# Patient Record
Sex: Female | Born: 1980 | Race: White | Hispanic: No | Marital: Single | Smoking: Former smoker
Health system: Southern US, Community
[De-identification: ages and names within clinical notes are randomized; demographics above are authoritative.]

---

## 2005-06-17 ENCOUNTER — Emergency Department: Payer: Self-pay | Admitting: Emergency Medicine

## 2005-06-20 ENCOUNTER — Ambulatory Visit: Payer: Self-pay | Admitting: Family Medicine

## 2006-03-27 IMAGING — CT CT STONE STUDY
1 of 2 series · 16 of 32 positions shown, 20 images · non-contrast
Comparison: none

REASON FOR EXAM: Stone
COMMENTS:

PROCEDURE:     CT  - CT ABDOMEN /PELVIS WO (STONE)  - June 17, 2005  [DATE]
RESULT:     Noncontrast emergent CT scan of abdomen and pelvis is performed
to evaluate for urinary tract stones.

[Series 2: stone · axial · 0.63mm/px · z∈[-436,-50]mm · 16 of 141 slices shown, 20 images]
[im 6/141  soft-tissue]
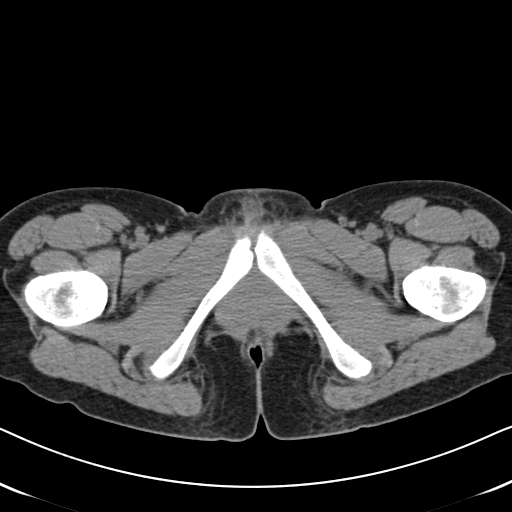
[im 6/141  bone]
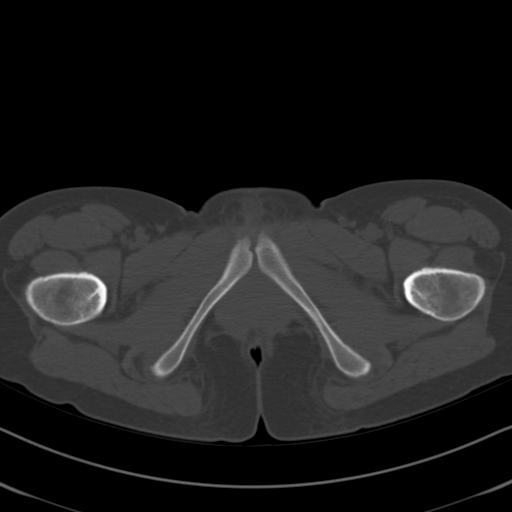
[im 17/141  soft-tissue]
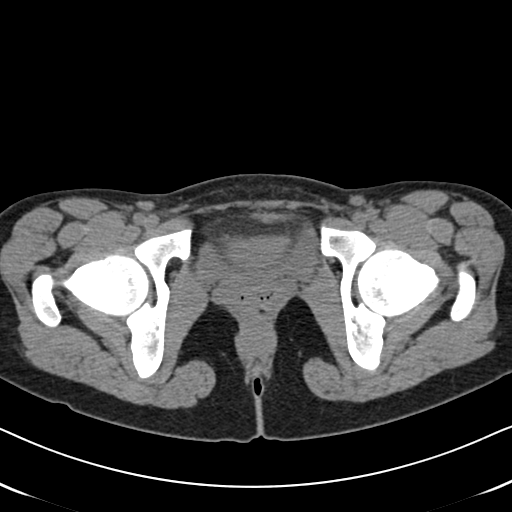
[im 29/141  soft-tissue]
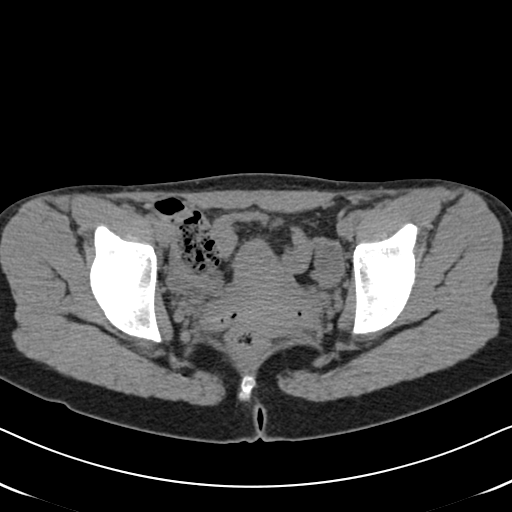
[im 40/141  soft-tissue]
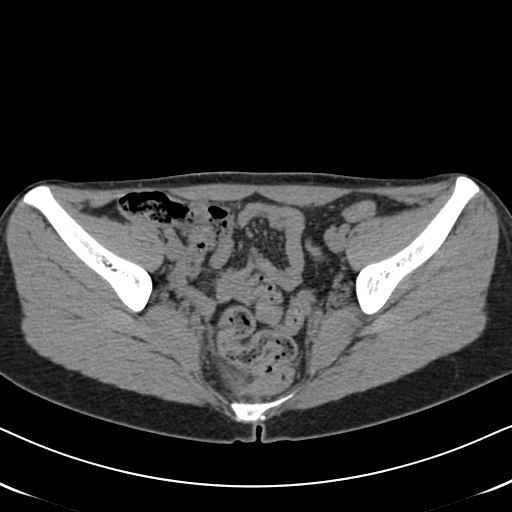
[im 45/141  soft-tissue]
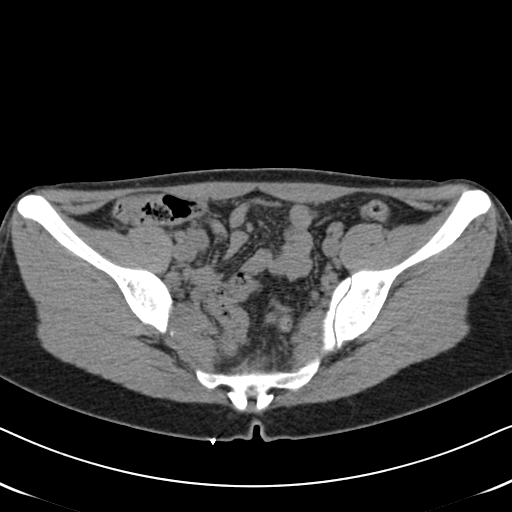
[im 57/141  soft-tissue]
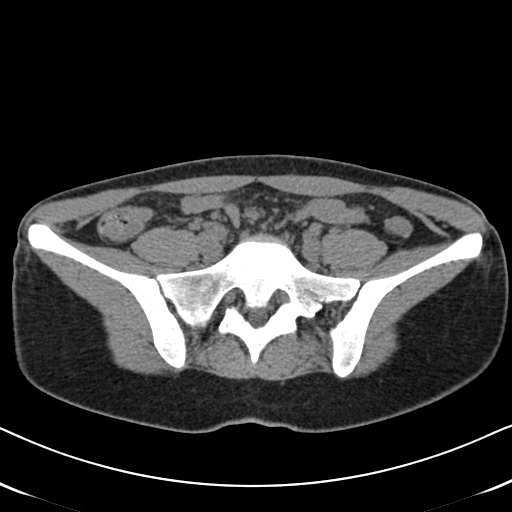
[im 68/141  soft-tissue]
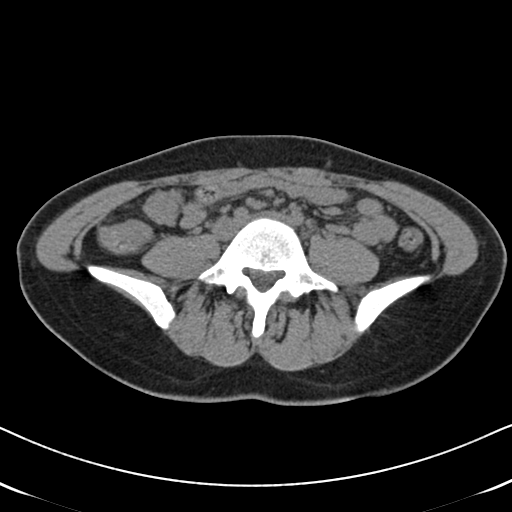
[im 73/141  soft-tissue]
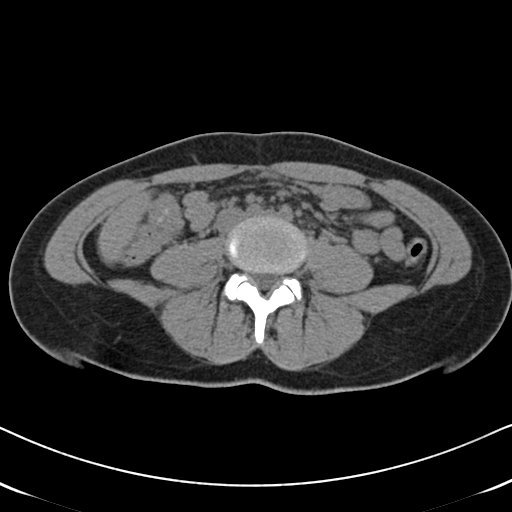
[im 85/141  soft-tissue]
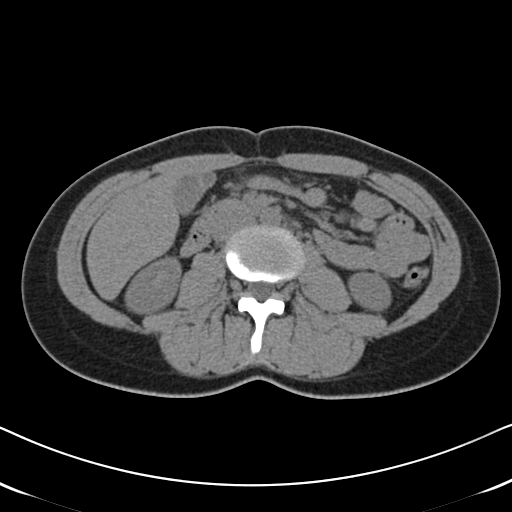
[im 85/141  bone]
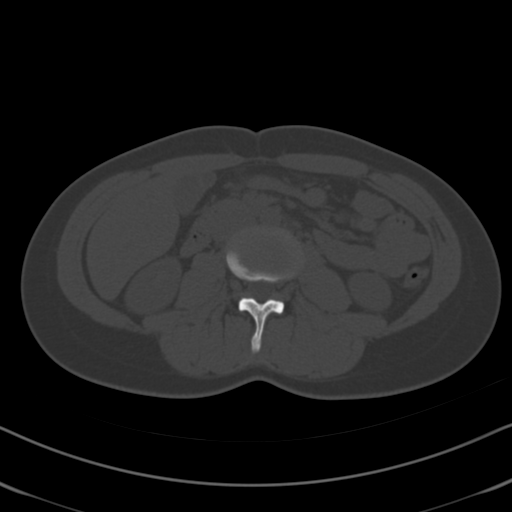
[im 96/141  soft-tissue]
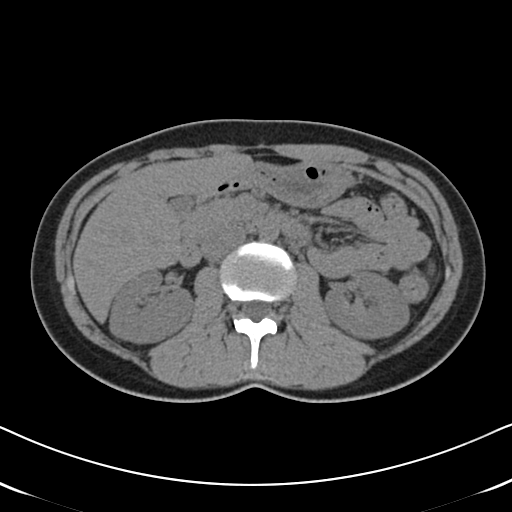
[im 107/141  soft-tissue]
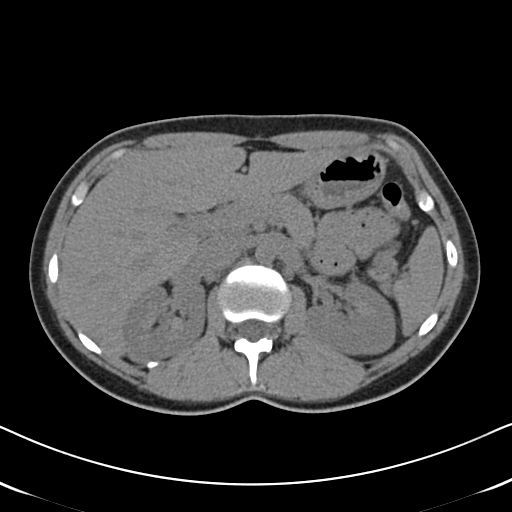
[im 113/141  soft-tissue]
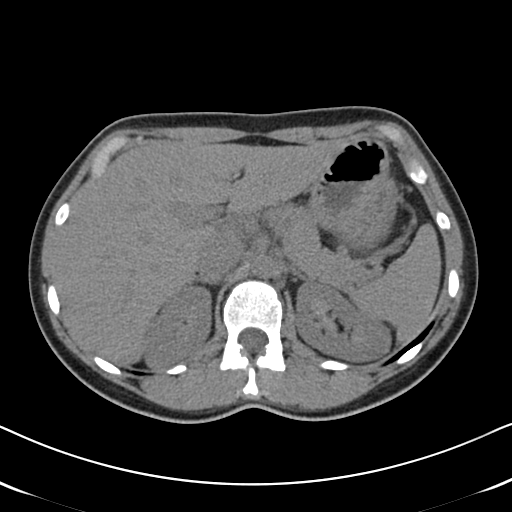
[im 118/141  lung]
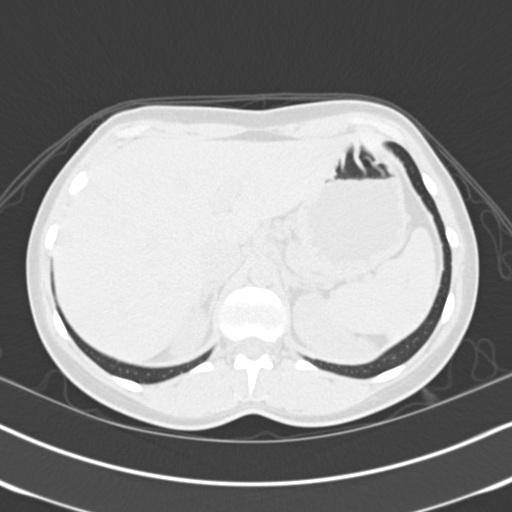
[im 124/141  soft-tissue]
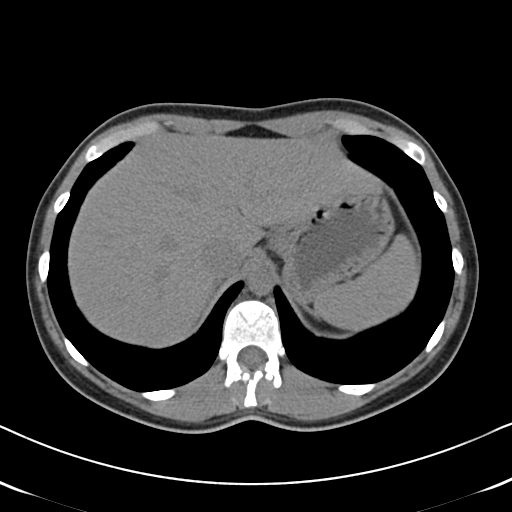
[im 124/141  lung]
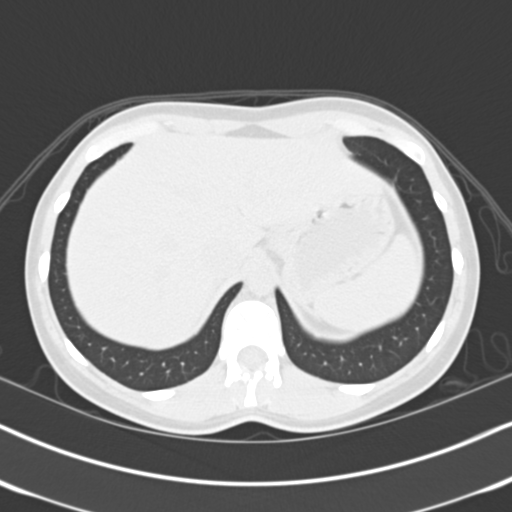
[im 129/141  lung]
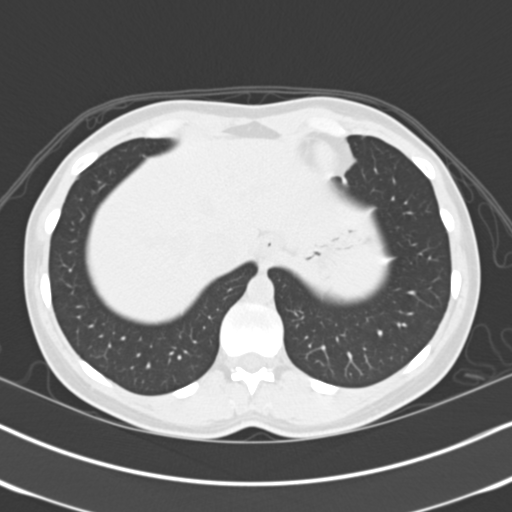
[im 135/141  soft-tissue]
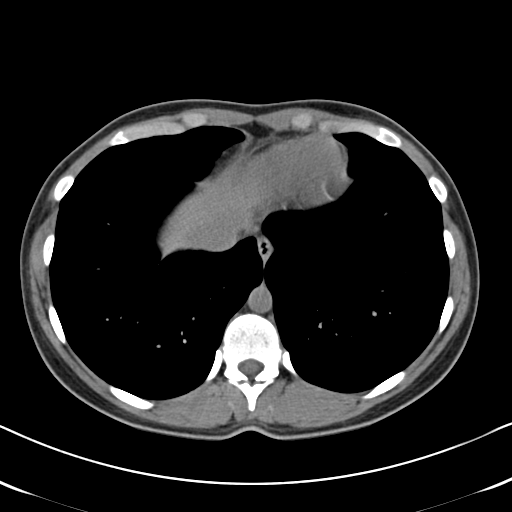
[im 135/141  lung]
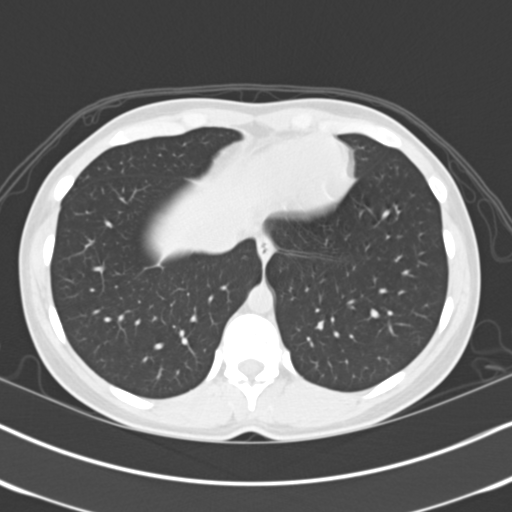

[16 of 32 positions shown; findings below may reference images not displayed]

FINDINGS: The lung bases are fully inflated and clear. The images
demonstrate phleboliths present in the pelvic region. The urinary bladder is
nondistended and contains no stones. The distal ureters and proximal ureters
are non-obstructed. There are some very faint punctate areas of density in
the RIGHT kidney in the caliceal regions which could represent
nephrocalcinosis or early stone formation. These are extremely subtle but
there is no hydronephrosis. There are no radiopaque gallstones present.
There is no free fluid or free air. No drainable loculated fluid collections
are evident.
IMPRESSION: No obstructive changes. I cannot exclude some minimal
nephrocalcinosis on the RIGHT.

## 2012-05-15 ENCOUNTER — Inpatient Hospital Stay: Payer: Self-pay | Admitting: Obstetrics and Gynecology

## 2012-05-15 LAB — CBC WITH DIFFERENTIAL/PLATELET
Basophil #: 0 10*3/uL (ref 0.0–0.1)
Eosinophil %: 0.8 %
HCT: 31.9 % — ABNORMAL LOW (ref 35.0–47.0)
HGB: 11.3 g/dL — ABNORMAL LOW (ref 12.0–16.0)
Lymphocyte #: 1.5 10*3/uL (ref 1.0–3.6)
Lymphocyte %: 16.1 %
MCH: 32.2 pg (ref 26.0–34.0)
MCV: 91 fL (ref 80–100)
Monocyte #: 0.8 x10 3/mm (ref 0.2–0.9)
Monocyte %: 8.9 %
Neutrophil #: 6.9 10*3/uL — ABNORMAL HIGH (ref 1.4–6.5)
Neutrophil %: 74 %
Platelet: 186 10*3/uL (ref 150–440)
RDW: 13.5 % (ref 11.5–14.5)
WBC: 9.3 10*3/uL (ref 3.6–11.0)

## 2012-05-17 LAB — HEMOGLOBIN: HGB: 8.4 g/dL — ABNORMAL LOW (ref 12.0–16.0)

## 2013-05-21 ENCOUNTER — Ambulatory Visit: Payer: Self-pay | Admitting: Family Medicine

## 2014-02-28 IMAGING — CT CT HEAD WITHOUT CONTRAST
1 series · 16 of 29 positions shown, 20 images · non-contrast
Comparison: none

REASON FOR EXAM: headache
COMMENTS:

[Series 2: soft tissue · axial · 0.39mm/px · z∈[-65,+65]mm · 16 of 29 slices shown, 20 images]
[im 2/29  brain]
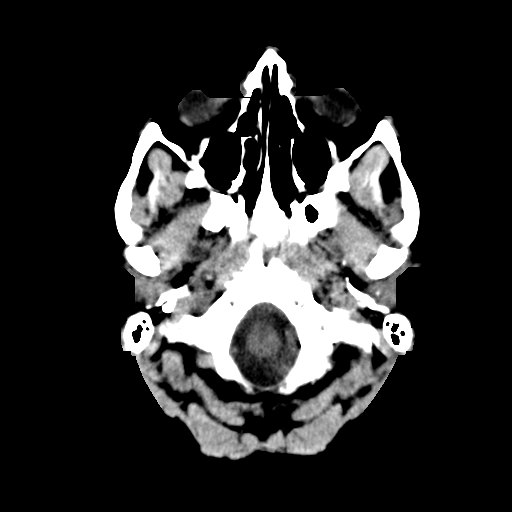
[im 2/29  bone]
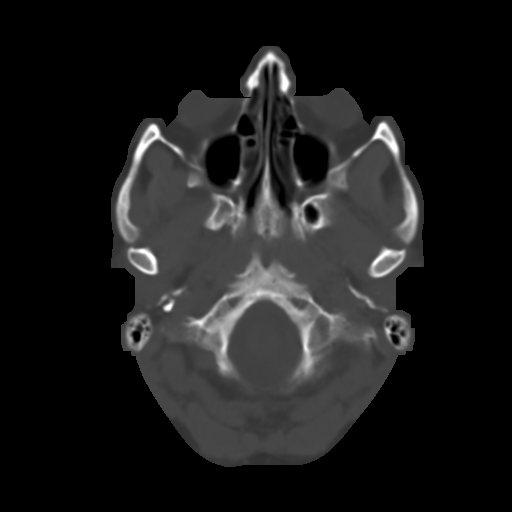
[im 4/29  brain]
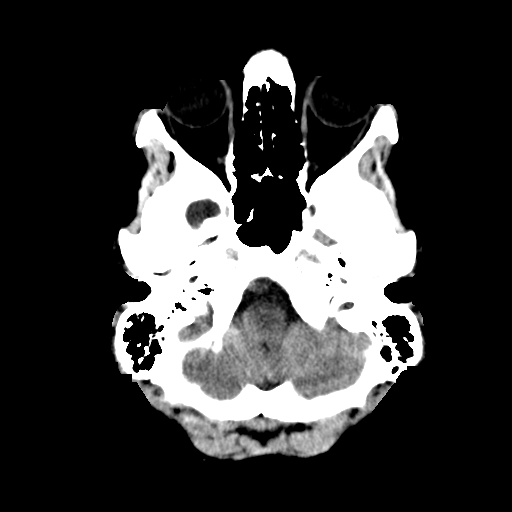
[im 6/29  brain]
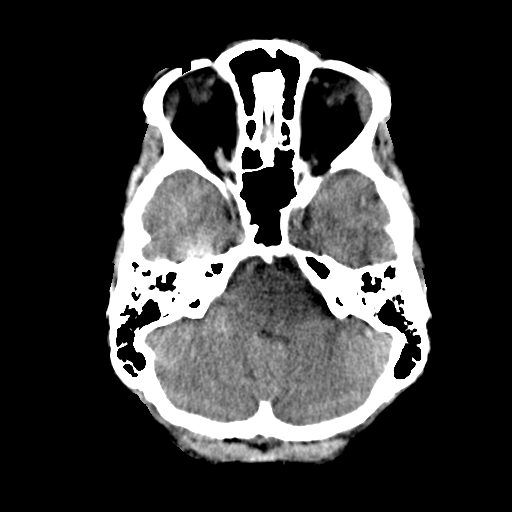
[im 7/29  brain]
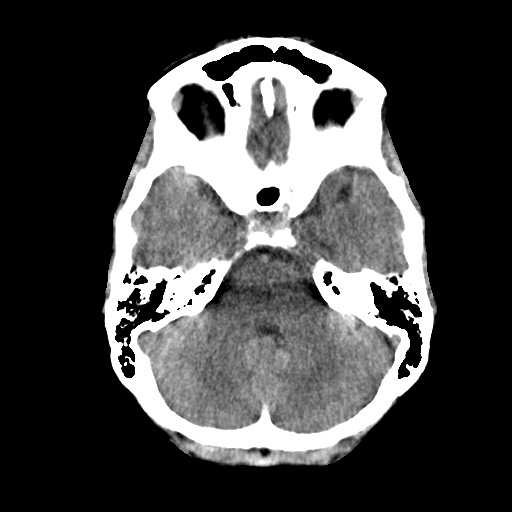
[im 9/29  brain]
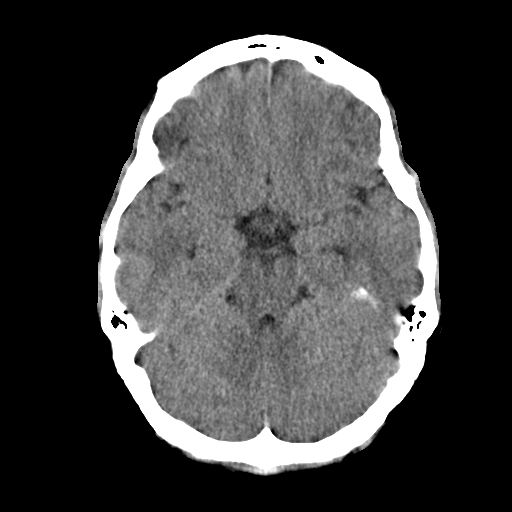
[im 9/29  bone]
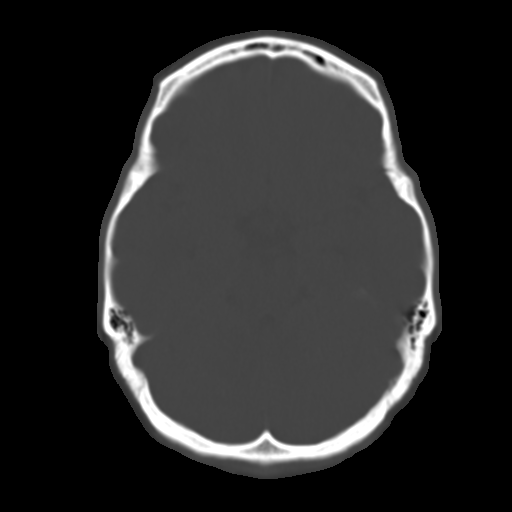
[im 11/29  brain]
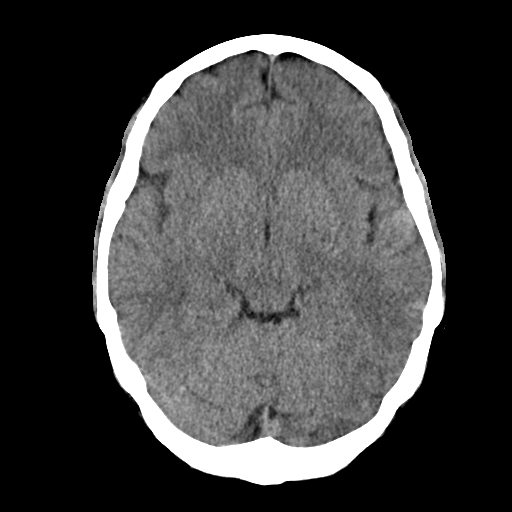
[im 12/29  brain]
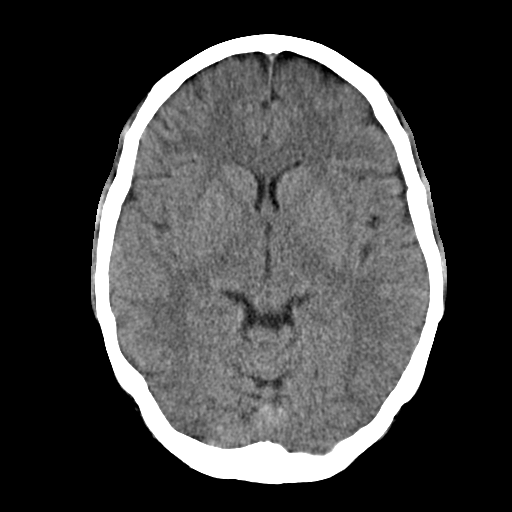
[im 14/29  brain]
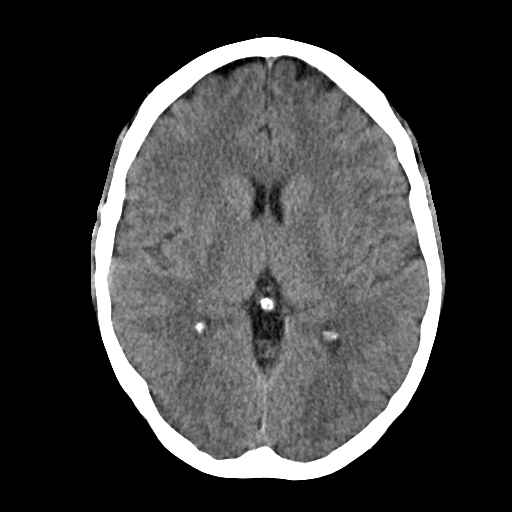
[im 16/29  brain]
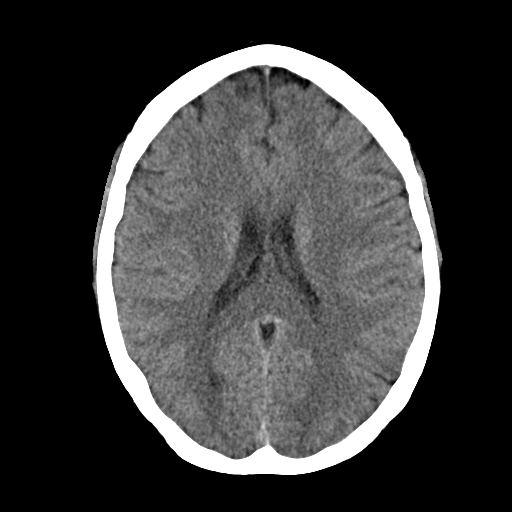
[im 16/29  bone]
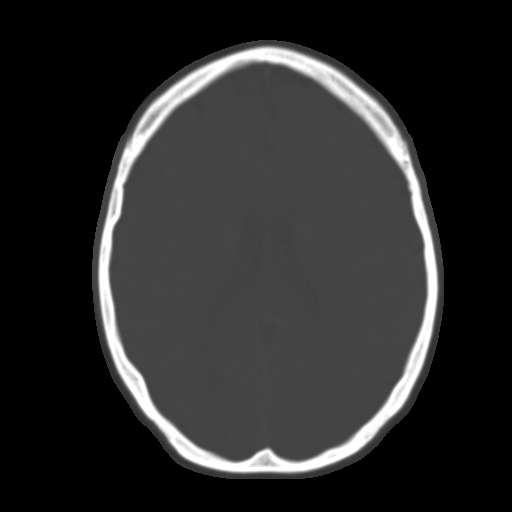
[im 18/29  brain]
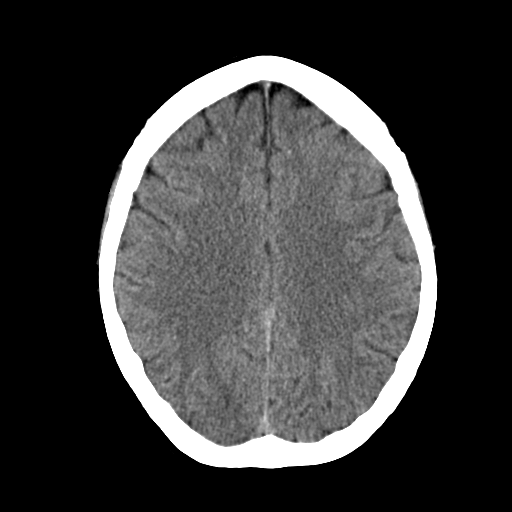
[im 19/29  brain]
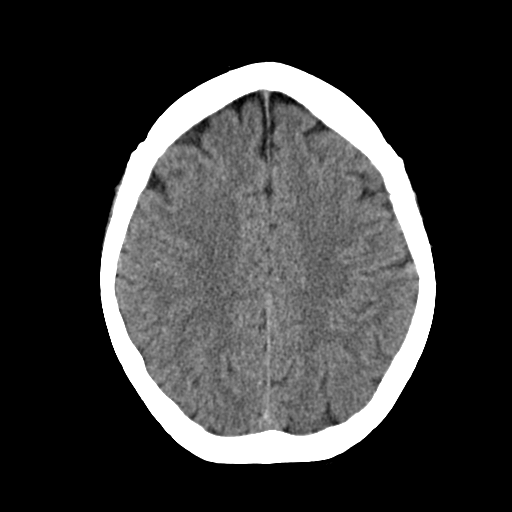
[im 21/29  brain]
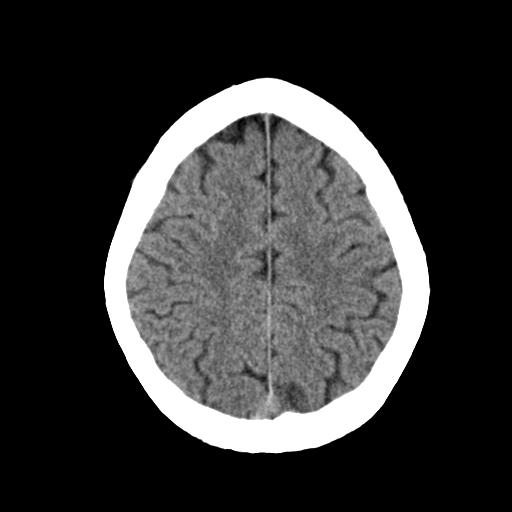
[im 23/29  brain]
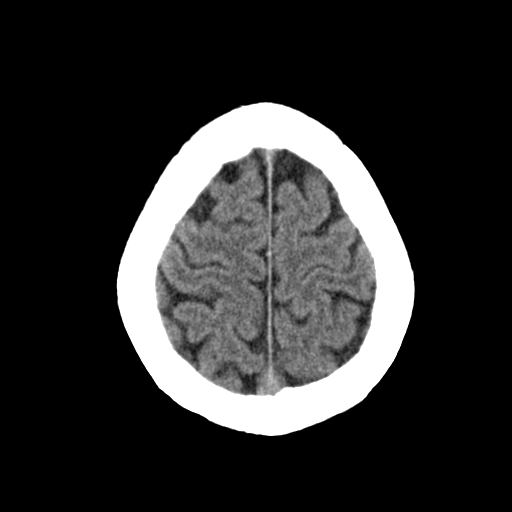
[im 23/29  bone]
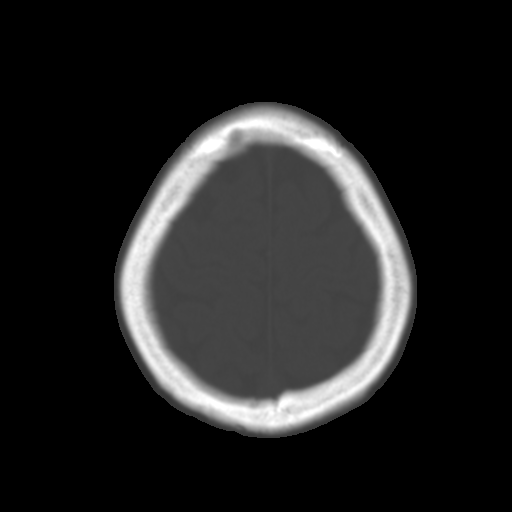
[im 24/29  brain]
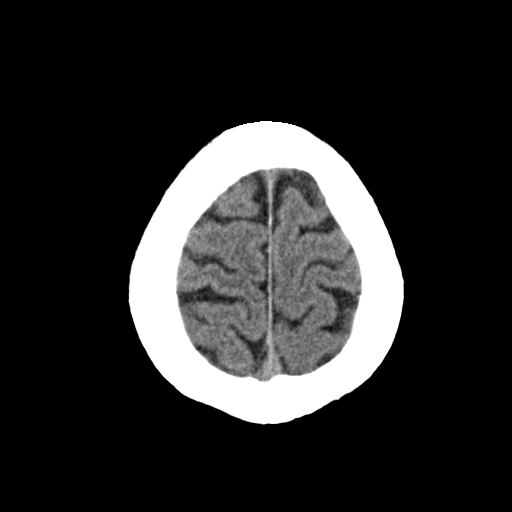
[im 26/29  brain]
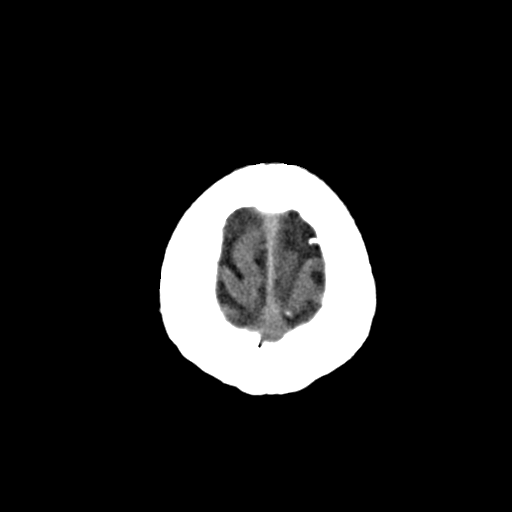
[im 28/29  brain]
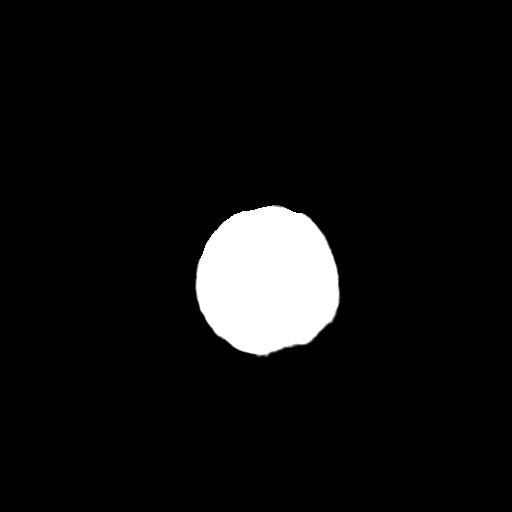

[16 of 29 positions shown; findings below may reference images not displayed]

PROCEDURE:     ALIN - SAM KEEL WITHOUT CONTRAST  - May 21, 2013 [DATE]

RESULT:     Noncontrast CT of the brain is performed in the standard fashion.

The ventricles and sulci are normal. There is no hemorrhage. There is no
focal mass, mass-effect or midline shift. There is no evidence of edema or
territorial infarct. The bone windows demonstrate normal aeration of the
paranasal sinuses and mastoid air cells. There is no skull fracture
demonstrated.
IMPRESSION: 1. No acute intracranial abnormality.

[REDACTED]

## 2015-02-10 NOTE — H&P (Signed)
L&D Evaluation:  History:   HPI 34 y/o G2P1 [id number]    Presents with abdominal pain, contractions    Patient's Medical History No Chronic Illness    Patient's Surgical History none    Medications Pre Natal Vitamins    Allergies NKDA    Social History none    Family History Non-Contributory   ROS:   ROS All systems were reviewed.  HEENT, CNS, GI, GU, Respiratory, CV, Renal and Musculoskeletal systems were found to be normal.   Exam:   Vital Signs stable    Abdomen gravid, non-tender    Estimated Fetal Weight Average for gestational age    Back no CVAT    Pelvic 4.5/75/-2    Mebranes AROM clear    FHT normal rate with no decels    Ucx irregular   Impression:   Impression early labor   Plan:   Plan monitor contractions and for cervical change, fluids    Comments Walk OK for epidural prn   Electronic Signatures: Margaretha GlassingEvans, Ricky L (MD)  (Signed 13-Aug-13 18:02)  Authored: L&D Evaluation   Last Updated: 13-Aug-13 18:02 by Margaretha GlassingEvans, Ricky L (MD)

## 2015-03-05 ENCOUNTER — Ambulatory Visit (INDEPENDENT_AMBULATORY_CARE_PROVIDER_SITE_OTHER): Payer: PRIVATE HEALTH INSURANCE | Admitting: Family Medicine

## 2015-03-05 ENCOUNTER — Encounter: Payer: Self-pay | Admitting: Family Medicine

## 2015-03-05 VITALS — BP 130/70 | HR 64 | Temp 98.2°F | Ht 68.0 in | Wt 138.0 lb

## 2015-03-05 DIAGNOSIS — H6501 Acute serous otitis media, right ear: Secondary | ICD-10-CM | POA: Diagnosis not present

## 2015-03-05 DIAGNOSIS — H6983 Other specified disorders of Eustachian tube, bilateral: Secondary | ICD-10-CM | POA: Diagnosis not present

## 2015-03-05 MED ORDER — FLUTICASONE PROPIONATE 50 MCG/ACT NA SUSP: 2.0000 | Freq: Every day | NASAL | Status: DC

## 2015-03-05 MED ORDER — AMOXICILLIN 500 MG PO CAPS: 500.0000 mg | ORAL_CAPSULE | Freq: Three times a day (TID) | ORAL | Status: DC

## 2015-03-05 NOTE — Progress Notes (Signed)
Name: Faith Mckinney   MRN: [id number]    DOB: 18-Jun-1981   Date:03/05/2015       Progress Note  Subjective  Chief Complaint  Chief Complaint  Patient presents with  . ear pressure    went swimming over the weekend- R) ear hurts  . Sinusitis    with ear pain    Ear Fullness  There is pain in the right ear. This is a new problem. The current episode started yesterday. The problem occurs constantly. The problem has been unchanged. There has been no fever. The patient is experiencing no pain. Pertinent negatives include no abdominal pain, coughing, diarrhea, ear discharge, headaches, neck pain, rash or sore throat. She has tried nothing for the symptoms. The treatment provided no relief. There is no history of a chronic ear infection or hearing loss.     No problem-specific assessment & plan notes found for this encounter.   History reviewed. No pertinent past medical history.  History reviewed. No pertinent past surgical history.  Family History  Problem Relation Age of Onset  . Hypertension Father     History   Social History  . Marital Status: Single    Spouse Name: N/A  . Number of Children: N/A  . Years of Education: N/A   Occupational History  . Not on file.   Social History Main Topics  . Smoking status: Former Games developermoker  . Smokeless tobacco: Not on file  . Alcohol Use: 0.0 oz/week    0 Standard drinks or equivalent per week  . Drug Use: No  . Sexual Activity: Not on file   Other Topics Concern  . Not on file   Social History Narrative  . No narrative on file     Current outpatient prescriptions:  .  amoxicillin (AMOXIL) 500 MG capsule, Take 1 capsule (500 mg total) by mouth 3 (three) times daily., Disp: 30 capsule, Rfl: 0 .  fluticasone (FLONASE) 50 MCG/ACT nasal spray, Place 2 sprays into both nostrils daily., Disp: 16 g, Rfl: 6  No Known Allergies   Review of Systems  Constitutional: Negative for fever, chills, weight loss and malaise/fatigue.   HENT: Negative for ear discharge, ear pain and sore throat.   Eyes: Negative for blurred vision.  Respiratory: Negative for cough, sputum production, shortness of breath and wheezing.   Cardiovascular: Negative for chest pain, palpitations and leg swelling.  Gastrointestinal: Negative for heartburn, nausea, abdominal pain, diarrhea, constipation, blood in stool and melena.  Genitourinary: Negative for dysuria, urgency, frequency and hematuria.  Musculoskeletal: Negative for myalgias, back pain, joint pain and neck pain.  Skin: Negative for rash.  Neurological: Negative for dizziness, tingling, sensory change, focal weakness and headaches.  Endo/Heme/Allergies: Negative for environmental allergies and polydipsia. Does not bruise/bleed easily.  Psychiatric/Behavioral: Negative for depression and suicidal ideas. The patient is not nervous/anxious and does not have insomnia.      Objective  Filed Vitals:   03/05/15 0823  BP: 130/70  Pulse: 64  Temp: 98.2 F (36.8 C)  Height: 5\' 8"  (1.727 m)  Weight: 138 lb (62.596 kg)    Physical Exam  Constitutional: She is well-developed, well-nourished, and in no distress.  HENT:  Head: Normocephalic.  Right Ear: External ear normal. Tympanic membrane is erythematous. Tympanic membrane is not retracted. A middle ear effusion is present.  Left Ear: External ear normal. Tympanic membrane is not retracted.  Mouth/Throat: No oropharyngeal exudate.  Eyes: Conjunctivae and EOM are normal. Pupils are equal, round, and reactive  to light. Right eye exhibits no discharge. Left eye exhibits no discharge. No scleral icterus.  Neck: Normal range of motion. Neck supple. No JVD present. No thyromegaly present.  Cardiovascular: Normal rate and normal heart sounds.   No murmur heard. Pulmonary/Chest: No respiratory distress. She has no wheezes. She has no rales.  Abdominal: Soft. There is no hepatomegaly. There is no tenderness.  Lymphadenopathy:    She has  no cervical adenopathy.  Skin: She is not diaphoretic. No erythema.      No results found for this or any previous visit (from the past 2160 hour(s)).   Assessment & Plan  Problem List Items Addressed This Visit    None    Visit Diagnoses    Acute dysfunction of both Eustachian tubes    -  Primary    Relevant Medications    fluticasone (FLONASE) 50 MCG/ACT nasal spray    Right acute serous otitis media, recurrence not specified        Relevant Medications    amoxicillin (AMOXIL) 500 MG capsule       Meds ordered this encounter  Medications  . DISCONTD: desogestrel-ethinyl estradiol (KARIVA,AZURETTE,MIRCETTE) 0.15-0.02/0.01 MG (21/5) tablet    Sig: Take 1 tablet by mouth daily. Dr Feliberto Gottron  . amoxicillin (AMOXIL) 500 MG capsule    Sig: Take 1 capsule (500 mg total) by mouth 3 (three) times daily.    Dispense:  30 capsule    Refill:  0  . fluticasone (FLONASE) 50 MCG/ACT nasal spray    Sig: Place 2 sprays into both nostrils daily.    Dispense:  16 g    Refill:  6     Dr. Elizabeth Sauer [facility]  03/05/2015

## 2018-04-09 ENCOUNTER — Ambulatory Visit: Payer: 59 | Admitting: Family Medicine

## 2018-04-09 ENCOUNTER — Encounter: Payer: Self-pay | Admitting: Family Medicine

## 2018-04-09 ENCOUNTER — Other Ambulatory Visit: Payer: Self-pay

## 2018-04-09 VITALS — BP 120/70 | HR 80 | Ht 68.0 in | Wt 134.0 lb

## 2018-04-09 DIAGNOSIS — B029 Zoster without complications: Secondary | ICD-10-CM | POA: Diagnosis not present

## 2018-04-09 MED ORDER — TRAMADOL HCL 50 MG PO TABS: 50.0000 mg | ORAL_TABLET | Freq: Four times a day (QID) | ORAL | 0 refills | Status: AC | PRN

## 2018-04-09 MED ORDER — VALACYCLOVIR HCL 1 G PO TABS: 1000.0000 mg | ORAL_TABLET | Freq: Three times a day (TID) | ORAL | 0 refills | Status: AC

## 2018-04-09 NOTE — Progress Notes (Signed)
Name: Faith Mckinney   MRN: [id number]    DOB: 1981/09/21   Date:04/09/2018       Progress Note  Subjective  Chief Complaint  Chief Complaint  Patient presents with  . Herpes Zoster    started with rash on Saturday. burns and itches.     Rash  This is a new problem. The current episode started in the past 7 days (saturday). The problem has been gradually worsening since onset. Location: left torso. The rash is characterized by blistering. She was exposed to nothing (zoster). Pertinent negatives include no anorexia, congestion, cough, diarrhea, eye pain, facial edema, fatigue, fever, joint pain, nail changes, rhinorrhea, shortness of breath, sore throat or vomiting. Past treatments include nothing. The treatment provided moderate relief.    No problem-specific Assessment & Plan notes found for this encounter.   History reviewed. No pertinent past medical history.  History reviewed. No pertinent surgical history.  Family History  Problem Relation Age of Onset  . Hypertension Father     Social History   Socioeconomic History  . Marital status: Single    Spouse name: Not on file  . Number of children: Not on file  . Years of education: Not on file  . Highest education level: Not on file  Occupational History  . Not on file  Social Needs  . Financial resource strain: Not on file  . Food insecurity:    Worry: Not on file    Inability: Not on file  . Transportation needs:    Medical: Not on file    Non-medical: Not on file  Tobacco Use  . Smoking status: Former Games developermoker  . Smokeless tobacco: Never Used  Substance and Sexual Activity  . Alcohol use: Yes    Alcohol/week: 0.0 oz  . Drug use: No  . Sexual activity: Not on file  Lifestyle  . Physical activity:    Days per week: Not on file    Minutes per session: Not on file  . Stress: Not on file  Relationships  . Social connections:    Talks on phone: Not on file    Gets together: Not on file    Attends religious  service: Not on file    Active member of club or organization: Not on file    Attends meetings of clubs or organizations: Not on file    Relationship status: Not on file  . Intimate partner violence:    Fear of current or ex partner: Not on file    Emotionally abused: Not on file    Physically abused: Not on file    Forced sexual activity: Not on file  Other Topics Concern  . Not on file  Social History Narrative  . Not on file    No Known Allergies  Outpatient Medications Prior to Visit  Medication Sig Dispense Refill  . desogestrel-ethinyl estradiol (KARIVA,AZURETTE,MIRCETTE) 0.15-0.02/0.01 MG (21/5) tablet Take 1 tablet by mouth daily.    Marland Kitchen. amoxicillin (AMOXIL) 500 MG capsule Take 1 capsule (500 mg total) by mouth 3 (three) times daily. 30 capsule 0  . fluticasone (FLONASE) 50 MCG/ACT nasal spray Place 2 sprays into both nostrils daily. 16 g 6   No facility-administered medications prior to visit.     Review of Systems  Constitutional: Negative for chills, fatigue, fever, malaise/fatigue and weight loss.  HENT: Negative for congestion, ear discharge, ear pain, rhinorrhea and sore throat.   Eyes: Negative for blurred vision and pain.  Respiratory: Negative for cough, sputum  production, shortness of breath and wheezing.   Cardiovascular: Negative for chest pain, palpitations and leg swelling.  Gastrointestinal: Negative for abdominal pain, anorexia, blood in stool, constipation, diarrhea, heartburn, melena, nausea and vomiting.  Genitourinary: Negative for dysuria, frequency, hematuria and urgency.  Musculoskeletal: Negative for back pain, joint pain, myalgias and neck pain.  Skin: Positive for rash. Negative for nail changes.  Neurological: Negative for dizziness, tingling, sensory change, focal weakness and headaches.  Endo/Heme/Allergies: Negative for environmental allergies and polydipsia. Does not bruise/bleed easily.  Psychiatric/Behavioral: Negative for depression and  suicidal ideas. The patient is not nervous/anxious and does not have insomnia.      Objective  Vitals:   04/09/18 1633  BP: 120/70  Pulse: 80  Weight: 134 lb (60.8 kg)  Height: 5\' 8"  (1.727 m)    Physical Exam  Constitutional: She is oriented to person, place, and time. She appears well-developed and well-nourished.  HENT:  Head: Normocephalic.  Right Ear: External ear normal.  Left Ear: External ear normal.  Mouth/Throat: Oropharynx is clear and moist.  Eyes: Pupils are equal, round, and reactive to light. Conjunctivae and EOM are normal. Lids are everted and swept, no foreign bodies found. Left eye exhibits no hordeolum. No foreign body present in the left eye. Right conjunctiva is not injected. Left conjunctiva is not injected. No scleral icterus.  Neck: Normal range of motion. Neck supple. No JVD present. No tracheal deviation present. No thyromegaly present.  Cardiovascular: Normal rate, regular rhythm, normal heart sounds and intact distal pulses. Exam reveals no gallop and no friction rub.  No murmur heard. Pulmonary/Chest: Effort normal and breath sounds normal. No respiratory distress. She has no wheezes. She has no rales.  Abdominal: Soft. Bowel sounds are normal. She exhibits no mass. There is no hepatosplenomegaly. There is no tenderness. There is no rebound and no guarding.  Musculoskeletal: Normal range of motion. She exhibits no edema or tenderness.  Lymphadenopathy:    She has no cervical adenopathy.  Neurological: She is alert and oriented to person, place, and time. She has normal strength. She displays normal reflexes. No cranial nerve deficit.  Skin: Skin is warm. No rash noted.  Psychiatric: She has a normal mood and affect. Her mood appears not anxious. She does not exhibit a depressed mood.  Nursing note and vitals reviewed.     Assessment & Plan  Problem List Items Addressed This Visit    None    Visit Diagnoses    Herpes zoster without complication     -  Primary   prescribed Valacyclovir and  gave Tramadol for pain   Relevant Medications   valACYclovir (VALTREX) 1000 MG tablet      Meds ordered this encounter  Medications  . valACYclovir (VALTREX) 1000 MG tablet    Sig: Take 1 tablet (1,000 mg total) by mouth 3 (three) times daily.    Dispense:  21 tablet    Refill:  0  . traMADol (ULTRAM) 50 MG tablet    Sig: Take 1 tablet (50 mg total) by mouth every 6 (six) hours as needed for up to 5 days.    Dispense:  20 tablet    Refill:  0      Dr. [facility]  04/09/18

## 2018-04-09 NOTE — Patient Instructions (Signed)
Shingles Shingles, which is also known as herpes zoster, is an infection that causes a painful skin rash and fluid-filled blisters. Shingles is not related to genital herpes, which is a sexually transmitted infection. Shingles only develops in people who:  Have had chickenpox.  Have received the chickenpox vaccine. (This is rare.)  What are the causes? Shingles is caused by varicella-zoster virus (VZV). This is the same virus that causes chickenpox. After exposure to VZV, the virus stays in the body in an inactive (dormant) state. Shingles develops if the virus reactivates. This can happen many years after the initial exposure to VZV. It is not known what causes this virus to reactivate. What increases the risk? People who have had chickenpox or received the chickenpox vaccine are at risk for shingles. Infection is more common in people who:  Are older than age 50.  Have a weakened defense (immune) system, such as those with HIV, AIDS, or cancer.  Are taking medicines that weaken the immune system, such as transplant medicines.  Are under great stress.  What are the signs or symptoms? Early symptoms of this condition include itching, tingling, and pain in an area on your skin. Pain may be described as burning, stabbing, or throbbing. A few days or weeks after symptoms start, a painful red rash appears, usually on one side of the body in a bandlike or beltlike pattern. The rash eventually turns into fluid-filled blisters that break open, scab over, and dry up in about 2-3 weeks. At any time during the infection, you may also develop:  A fever.  Chills.  A headache.  An upset stomach.  How is this diagnosed? This condition is diagnosed with a skin exam. Sometimes, skin or fluid samples are taken from the blisters before a diagnosis is made. These samples are examined under a microscope or sent to a lab for testing. How is this treated? There is no specific cure for this condition.  Your health care provider will probably prescribe medicines to help you manage pain, recover more quickly, and avoid long-term problems. Medicines may include:  Antiviral drugs.  Anti-inflammatory drugs.  Pain medicines.  If the area involved is on your face, you may be referred to a specialist, such as an eye doctor (ophthalmologist) or an ear, nose, and throat (ENT) doctor to help you avoid eye problems, chronic pain, or disability. Follow these instructions at home: Medicines  Take medicines only as directed by your health care provider.  Apply an anti-itch or numbing cream to the affected area as directed by your health care provider. Blister and Rash Care  Take a cool bath or apply cool compresses to the area of the rash or blisters as directed by your health care provider. This may help with pain and itching.  Keep your rash covered with a loose bandage (dressing). Wear loose-fitting clothing to help ease the pain of material rubbing against the rash.  Keep your rash and blisters clean with mild soap and cool water or as directed by your health care provider.  Check your rash every day for signs of infection. These include redness, swelling, and pain that lasts or increases.  Do not pick your blisters.  Do not scratch your rash. General instructions  Rest as directed by your health care provider.  Keep all follow-up visits as directed by your health care provider. This is important.  Until your blisters scab over, your infection can cause chickenpox in people who have never had it or been vaccinated   against it. To prevent this from happening, avoid contact with other people, especially: ? Babies. ? Pregnant women. ? Children who have eczema. ? Elderly people who have transplants. ? People who have chronic illnesses, such as leukemia or AIDS. Contact a health care provider if:  Your pain is not relieved with prescribed medicines.  Your pain does not get better after  the rash heals.  Your rash looks infected. Signs of infection include redness, swelling, and pain that lasts or increases. Get help right away if:  The rash is on your face or nose.  You have facial pain, pain around your eye area, or loss of feeling on one side of your face.  You have ear pain or you have ringing in your ear.  You have loss of taste.  Your condition gets worse. This information is not intended to replace advice given to you by your health care provider. Make sure you discuss any questions you have with your health care provider. Document Released: 09/19/2005 Document Revised: 05/15/2016 Document Reviewed: 07/31/2014 Elsevier Interactive Patient Education  2018 [employer]  

## 2020-12-02 ENCOUNTER — Ambulatory Visit: Payer: Self-pay | Admitting: *Deleted

## 2020-12-02 NOTE — Telephone Encounter (Signed)
Pt called complaints of blood in her stool; for the past 3 days she has seen bright red blood in the toilet; the blood is separate from the BM; she had 1 episode in 2020; te pt says she normally has a BM every morning; she is having "bubbling in her stomach"; she also says she discomfort in her abdomen rated 2-3 out of 10; recommendations made per nurse triage protocol; she verbalized understanding and says she is not and can not afford to go to the ED; the pt sees Dr Elizabeth Sauer. [facility]; spoke with Marchelle Folks in the office; she requested this information be sent to the office for provider review; pt informed and verbalized understanding; will route per request.  Reason for Disposition . [1] Constant abdominal pain AND [2] present > 2 hours  Answer Assessment - Initial Assessment Questions 1. APPEARANCE of BLOOD: "What color is it?" "Is it passed separately, on the surface of the stool, or mixed in with the stool?"      Bright red; separate from BM 2. AMOUNT: "How much blood was passed?"     1/4 cup" 3. FREQUENCY: "How many times has blood been passed with the stools?"      Daily x 3 days 4. ONSET: "When was the blood first seen in the stools?" (Days or weeks)      11/30/20 5. DIARRHEA: "Is there also some diarrhea?" If Yes, ask: "How many diarrhea stools were passed in past 24 hours?"      no 6. CONSTIPATION: "Do you have constipation?" If Yes, ask: "How bad is it?"    no 7. RECURRENT SYMPTOMS: "Have you had blood in your stools before?" If Yes, ask: "When was the last time?" and "What happened that time?"     Yes, 1 episode in 2020; did not see MD due to COVID 8. BLOOD THINNERS: "Do you take any blood thinners?" (e.g., Coumadin/warfarin, Pradaxa/dabigatran, aspirin)   No 9. OTHER SYMPTOMS: "Do you have any other symptoms?"  (e.g., abdominal pain, vomiting, dizziness, fever)  Abdominal "discomfort" rated 2 out of 10 10. PREGNANCY: "Is there any chance you are pregnant?" "When was your  last menstrual period?"       Birth control pills, LBM 11/25/20  Protocols used: RECTAL BLEEDING-A-AH

## 2022-05-17 ENCOUNTER — Encounter: Payer: 59 | Admitting: Family Medicine

## 2022-09-12 ENCOUNTER — Telehealth: Payer: Self-pay

## 2022-09-12 NOTE — Telephone Encounter (Signed)
Called pt concerning her upcoming appt on Friday

## 2022-09-16 ENCOUNTER — Ambulatory Visit: Payer: 59 | Admitting: Family Medicine

## 2023-09-21 ENCOUNTER — Other Ambulatory Visit: Payer: Self-pay | Admitting: Obstetrics and Gynecology

## 2023-09-21 DIAGNOSIS — Z1231 Encounter for screening mammogram for malignant neoplasm of breast: Secondary | ICD-10-CM

## 2023-12-06 ENCOUNTER — Ambulatory Visit: Payer: Self-pay

## 2023-12-06 DIAGNOSIS — Z800 Family history of malignant neoplasm of digestive organs: Secondary | ICD-10-CM | POA: Diagnosis not present

## 2023-12-06 DIAGNOSIS — Z09 Encounter for follow-up examination after completed treatment for conditions other than malignant neoplasm: Secondary | ICD-10-CM | POA: Diagnosis present

## 2023-12-06 DIAGNOSIS — K640 First degree hemorrhoids: Secondary | ICD-10-CM | POA: Diagnosis not present

## 2023-12-06 DIAGNOSIS — Z8719 Personal history of other diseases of the digestive system: Secondary | ICD-10-CM | POA: Diagnosis not present
